# Patient Record
Sex: Male | Born: 1985 | Race: Black or African American | Hispanic: No | Marital: Married | Smoking: Current every day smoker
Health system: Southern US, Community
[De-identification: ages and names within clinical notes are randomized; demographics above are authoritative.]

## PROBLEM LIST (undated history)

## (undated) DIAGNOSIS — K219 Gastro-esophageal reflux disease without esophagitis: Secondary | ICD-10-CM

## (undated) DIAGNOSIS — M109 Gout, unspecified: Secondary | ICD-10-CM

## (undated) DIAGNOSIS — R569 Unspecified convulsions: Secondary | ICD-10-CM

## (undated) HISTORY — PX: HERNIA REPAIR: SHX51

## (undated) HISTORY — PX: FINGER SURGERY: SHX640

---

## 2013-03-20 ENCOUNTER — Encounter (HOSPITAL_BASED_OUTPATIENT_CLINIC_OR_DEPARTMENT_OTHER): Payer: Self-pay | Admitting: Emergency Medicine

## 2013-03-20 ENCOUNTER — Emergency Department (HOSPITAL_BASED_OUTPATIENT_CLINIC_OR_DEPARTMENT_OTHER)
Admission: EM | Admit: 2013-03-20 | Discharge: 2013-03-20 | Disposition: A | Discharge status: Home / Self Care | Payer: Self-pay | Attending: Emergency Medicine | Admitting: Emergency Medicine

## 2013-03-20 DIAGNOSIS — Z8719 Personal history of other diseases of the digestive system: Secondary | ICD-10-CM | POA: Insufficient documentation

## 2013-03-20 DIAGNOSIS — K5289 Other specified noninfective gastroenteritis and colitis: Secondary | ICD-10-CM | POA: Insufficient documentation

## 2013-03-20 DIAGNOSIS — Z87891 Personal history of nicotine dependence: Secondary | ICD-10-CM | POA: Insufficient documentation

## 2013-03-20 DIAGNOSIS — Z8669 Personal history of other diseases of the nervous system and sense organs: Secondary | ICD-10-CM | POA: Insufficient documentation

## 2013-03-20 DIAGNOSIS — K529 Noninfective gastroenteritis and colitis, unspecified: Secondary | ICD-10-CM

## 2013-03-20 DIAGNOSIS — M545 Low back pain, unspecified: Secondary | ICD-10-CM | POA: Insufficient documentation

## 2013-03-20 HISTORY — DX: Unspecified convulsions: R56.9

## 2013-03-20 HISTORY — DX: Gastro-esophageal reflux disease without esophagitis: K21.9

## 2013-03-20 LAB — URINALYSIS, ROUTINE W REFLEX MICROSCOPIC
Bilirubin Urine: NEGATIVE
GLUCOSE, UA: NEGATIVE mg/dL
HGB URINE DIPSTICK: NEGATIVE
KETONES UR: 15 mg/dL — AB
Leukocytes, UA: NEGATIVE
NITRITE: NEGATIVE
PH: 6.5 (ref 5.0–8.0)
PROTEIN: NEGATIVE mg/dL
Specific Gravity, Urine: 1.011 (ref 1.005–1.030)
Urobilinogen, UA: 0.2 mg/dL (ref 0.0–1.0)

## 2013-03-20 MED ORDER — DICYCLOMINE HCL 10 MG PO CAPS
10.0000 mg | ORAL_CAPSULE | Freq: Once | ORAL | Status: AC
  Administered 2013-03-20: 10 mg via ORAL
  Filled 2013-03-20: qty 1

## 2013-03-20 MED ORDER — DICYCLOMINE HCL 20 MG PO TABS: 20.0000 mg | ORAL_TABLET | Freq: Two times a day (BID) | ORAL | Status: AC

## 2013-03-20 NOTE — Discharge Instructions (Signed)
Diet for Diarrhea, Adult °Frequent, runny stools (diarrhea) may be caused or worsened by food or drink. Diarrhea may be relieved by changing your diet. Since diarrhea can last up to 7 days, it is easy for you to lose too much fluid from the body and become dehydrated. Fluids that are lost need to be replaced. Along with a modified diet, make sure you drink enough fluids to keep your urine clear or pale yellow. °DIET INSTRUCTIONS °· Ensure adequate fluid intake (hydration): have 1 cup (8 oz) of fluid for each diarrhea episode. Avoid fluids that contain simple sugars or sports drinks, fruit juices, whole milk products, and sodas. Your urine should be clear or pale yellow if you are drinking enough fluids. Hydrate with an oral rehydration solution that you can purchase at pharmacies, retail stores, and online. You can prepare an oral rehydration solution at home by mixing the following ingredients together: °·   tsp table salt. °· ¾ tsp baking soda. °·  tsp salt substitute containing potassium chloride. °· 1  tablespoons sugar. °· 1 L (34 oz) of water. °· Certain foods and beverages may increase the speed at which food moves through the gastrointestinal (GI) tract. These foods and beverages should be avoided and include: °· Caffeinated and alcoholic beverages. °· High-fiber foods, such as raw fruits and vegetables, nuts, seeds, and whole grain breads and cereals. °· Foods and beverages sweetened with sugar alcohols, such as xylitol, sorbitol, and mannitol. °· Some foods may be well tolerated and may help thicken stool including: °· Starchy foods, such as rice, toast, pasta, low-sugar cereal, oatmeal, grits, baked potatoes, crackers, and bagels.   °· Bananas.   °· Applesauce. °· Add probiotic-rich foods to help increase healthy bacteria in the GI tract, such as yogurt and fermented milk products. °RECOMMENDED FOODS AND BEVERAGES °Starches °Choose foods with less than 2 g of fiber per serving. °· Recommended:  White,  French, and pita breads, plain rolls, buns, bagels. Plain muffins, matzo. Soda, saltine, or graham crackers. Pretzels, melba toast, zwieback. Cooked cereals made with water: cornmeal, farina, cream cereals. Dry cereals: refined corn, wheat, rice. Potatoes prepared any way without skins, refined macaroni, spaghetti, noodles, refined rice. °· Avoid:  Bread, rolls, or crackers made with whole wheat, multi-grains, rye, bran seeds, nuts, or coconut. Corn tortillas or taco shells. Cereals containing whole grains, multi-grains, bran, coconut, nuts, raisins. Cooked or dry oatmeal. Coarse wheat cereals, granola. Cereals advertised as "high-fiber." Potato skins. Whole grain pasta, wild or brown rice. Popcorn. Sweet potatoes, yams. Sweet rolls, doughnuts, waffles, pancakes, sweet breads. °Vegetables °· Recommended: Strained tomato and vegetable juices. Most well-cooked and canned vegetables without seeds. Fresh: Tender lettuce, cucumber without the skin, cabbage, spinach, bean sprouts. °· Avoid: Fresh, cooked, or canned: Artichokes, baked beans, beet greens, broccoli, Brussels sprouts, corn, kale, legumes, peas, sweet potatoes. Cooked: Green or red cabbage, spinach. Avoid large servings of any vegetables because vegetables shrink when cooked, and they contain more fiber per serving than fresh vegetables. °Fruit °· Recommended: Cooked or canned: Apricots, applesauce, cantaloupe, cherries, fruit cocktail, grapefruit, grapes, kiwi, mandarin oranges, peaches, pears, plums, watermelon. Fresh: Apples without skin, ripe banana, grapes, cantaloupe, cherries, grapefruit, peaches, oranges, plums. Keep servings limited to ½ cup or 1 piece. °· Avoid: Fresh: Apples with skin, apricots, mangoes, pears, raspberries, strawberries. Prune juice, stewed or dried prunes. Dried fruits, raisins, dates. Large servings of all fresh fruits. °Protein °· Recommended: Ground or well-cooked tender beef, ham, veal, lamb, pork, or poultry. Eggs. Fish,  oysters, shrimp,   lobster, other seafoods. Liver, organ meats. °· Avoid: Tough, fibrous meats with gristle. Peanut butter, smooth or chunky. Cheese, nuts, seeds, legumes, dried peas, beans, lentils. °Dairy °· Recommended: Yogurt, lactose-free milk, kefir, drinkable yogurt, buttermilk, soy milk, or plain hard cheese. °· Avoid: Milk, chocolate milk, beverages made with milk, such as milkshakes. °Soups °· Recommended: Bouillon, broth, or soups made from allowed foods. Any strained soup. °· Avoid: Soups made from vegetables that are not allowed, cream or milk-based soups. °Desserts and Sweets °· Recommended: Sugar-free gelatin, sugar-free frozen ice pops made without sugar alcohol. °· Avoid: Plain cakes and cookies, pie made with fruit, pudding, custard, cream pie. Gelatin, fruit, ice, sherbet, frozen ice pops. Ice cream, ice milk without nuts. Plain hard candy, honey, jelly, molasses, syrup, sugar, chocolate syrup, gumdrops, marshmallows. °Fats and Oils °· Recommended: Limit fats to less than 8 tsp per day. °· Avoid: Seeds, nuts, olives, avocados. Margarine, butter, cream, mayonnaise, salad oils, plain salad dressings. Plain gravy, crisp bacon without rind. °Beverages °· Recommended: Water, decaffeinated teas, oral rehydration solutions, sugar-free beverages not sweetened with sugar alcohols. °· Avoid: Fruit juices, caffeinated beverages (coffee, tea, soda), alcohol, sports drinks, or lemon-lime soda. °Condiments °· Recommended: Ketchup, mustard, horseradish, vinegar, cocoa powder. Spices in moderation: allspice, basil, bay leaves, celery powder or leaves, cinnamon, cumin powder, curry powder, ginger, mace, marjoram, onion or garlic powder, oregano, paprika, parsley flakes, ground pepper, rosemary, sage, savory, tarragon, thyme, turmeric. °· Avoid: Coconut, honey. °Document Released: 03/13/2003 Document Revised: 09/15/2011 Document Reviewed: 05/07/2011 °[employer]® Patient Information ©2014 [employer]. ° °Viral  Gastroenteritis °Viral gastroenteritis is also known as stomach flu. This condition affects the stomach and intestinal tract. It can cause sudden diarrhea and vomiting. The illness typically lasts 3 to 8 days. Most people develop an immune response that eventually gets rid of the virus. While this natural response develops, the virus can make you quite ill. °CAUSES  °Many different viruses can cause gastroenteritis, such as rotavirus or noroviruses. You can catch one of these viruses by consuming contaminated food or water. You may also catch a virus by sharing utensils or other personal items with an infected person or by touching a contaminated surface. °SYMPTOMS  °The most common symptoms are diarrhea and vomiting. These problems can cause a severe loss of body fluids (dehydration) and a body salt (electrolyte) imbalance. Other symptoms may include: °· Fever. °· Headache. °· Fatigue. °· Abdominal pain. °DIAGNOSIS  °Your caregiver can usually diagnose viral gastroenteritis based on your symptoms and a physical exam. A stool sample may also be taken to test for the presence of viruses or other infections. °TREATMENT  °This illness typically goes away on its own. Treatments are aimed at rehydration. The most serious cases of viral gastroenteritis involve vomiting so severely that you are not able to keep fluids down. In these cases, fluids must be given through an intravenous line (IV). °HOME CARE INSTRUCTIONS  °· Drink enough fluids to keep your urine clear or pale yellow. Drink small amounts of fluids frequently and increase the amounts as tolerated. °· Ask your caregiver for specific rehydration instructions. °· Avoid: °· Foods high in sugar. °· Alcohol. °· Carbonated drinks. °· Tobacco. °· Juice. °· Caffeine drinks. °· Extremely hot or cold fluids. °· Fatty, greasy foods. °· Too much intake of anything at one time. °· Dairy products until 24 to 48 hours after diarrhea stops. °· You may consume probiotics.  Probiotics are active cultures of beneficial bacteria. They may lessen the amount and number   of diarrheal stools in adults. Probiotics can be found in yogurt with active cultures and in supplements. °· Wash your hands well to avoid spreading the virus. °· Only take over-the-counter or prescription medicines for pain, discomfort, or fever as directed by your caregiver. Do not give aspirin to children. Antidiarrheal medicines are not recommended. °· Ask your caregiver if you should continue to take your regular prescribed and over-the-counter medicines. °· Keep all follow-up appointments as directed by your caregiver. °SEEK IMMEDIATE MEDICAL CARE IF:  °· You are unable to keep fluids down. °· You do not urinate at least once every 6 to 8 hours. °· You develop shortness of breath. °· You notice blood in your stool or vomit. This may look like coffee grounds. °· You have abdominal pain that increases or is concentrated in one small area (localized). °· You have persistent vomiting or diarrhea. °· You have a fever. °· The patient is a child younger than 3 months, and he or she has a fever. °· The patient is a child older than 3 months, and he or she has a fever and persistent symptoms. °· The patient is a child older than 3 months, and he or she has a fever and symptoms suddenly get worse. °· The patient is a baby, and he or she has no tears when crying. °MAKE SURE YOU:  °· Understand these instructions. °· Will watch your condition. °· Will get help right away if you are not doing well or get worse. °Document Released: 12/21/2004 Document Revised: 03/15/2011 Document Reviewed: 10/07/2010 °ExitCare® Patient Information ©2014 [employer]. ° °

## 2013-03-20 NOTE — ED Notes (Signed)
Patient states he has a two week history of intestinal bug with diarrhea.  States the diarrhea is resolving.  Has been taking Prevacid.  Continues to have burning in his stomach.  Generalized body aches and fatigue.

## 2013-03-20 NOTE — ED Provider Notes (Signed)
CSN: [id number]     Arrival date & time 03/20/13  1149 History   First MD Initiated Contact with Patient 03/20/13 1209     Chief Complaint  Patient presents with  . Back Pain     (Consider location/radiation/quality/duration/timing/severity/associated sxs/prior Treatment) HPI [age] show male comes in today stating that he had gastroenteritis-type symptoms 2 weeks ago followed by some reflux type symptoms. He was seen at [facility] and started on medication for this this helped. He states that he was improved until Saturday night when he began having some abdominal cramping and nausea followed by several episodes of diarrhea on Sunday. He states he is continued to have some crampy upper normal pain whenever he tries to the or drink. Not had any vomiting and the diarrhea has resolved. He had some subjective fever but has not had any chills. He states that he feels like he was urinating more than usual and had some low back pain. He has not had any similar episodes in the past. He has had a hernia repair but no other abdominal surgery.  Past Medical History  Diagnosis Date  . Acid reflux   . Seizures    Past Surgical History  Procedure Laterality Date  . Hernia repair    . Finger surgery     No family history on file. History  Substance Use Topics  . Smoking status: Former Games developermoker  . Smokeless tobacco: Not on file  . Alcohol Use: No    Review of Systems  All other systems reviewed and are negative.      Allergies  Prevacid  Home Medications  No current outpatient prescriptions on file. BP 143/91  Temp(Src) 97.8 F (36.6 C) (Oral)  Resp 20  Ht 5\' 11"  (1.803 m)  Wt 224 lb (101.606 kg)  BMI 31.26 kg/m2  SpO2 100% Physical Exam  Nursing note and vitals reviewed. Constitutional: He is oriented to person, place, and time. He appears well-developed and well-nourished.  HENT:  Head: Normocephalic and atraumatic.  Right Ear: External ear normal.  Left Ear: External  ear normal.  Nose: Nose normal.  Mouth/Throat: Oropharynx is clear and moist.  Eyes: Conjunctivae and EOM are normal. Pupils are equal, round, and reactive to light.  Neck: Normal range of motion. Neck supple.  Cardiovascular: Normal rate, regular rhythm, normal heart sounds and intact distal pulses.   Pulmonary/Chest: Effort normal and breath sounds normal. No respiratory distress. He has no wheezes. He exhibits no tenderness.  Abdominal: Soft. Bowel sounds are normal. He exhibits no distension and no mass. There is no tenderness. There is no guarding.  Musculoskeletal: Normal range of motion.  Neurological: He is alert and oriented to person, place, and time. He has normal reflexes. He exhibits normal muscle tone. Coordination normal.  Skin: Skin is warm and dry.  Psychiatric: He has a normal mood and affect. His behavior is normal. Judgment and thought content normal.    ED Course  Procedures (including critical care time) Labs Review Labs Reviewed  URINALYSIS, ROUTINE W REFLEX MICROSCOPIC - Abnormal; Notable for the following:    Ketones, ur 15 (*)    All other components within normal limits   Imaging Review No results found.   EKG Interpretation None      MDM   Final diagnoses:  None    Patient is advised to have clear liquids for the next 12-24 hours. He is given Bentyl. He is given referral to primary care. He is given return precautions  and voices understanding that if the pain worsens, localizes, this not resolve in the next 24 hours that he should be reevaluated. Currently his abdomen is soft and nontender. Urine does not show any evidence of infection we does have some ketones. He has not actually vomiting and is taking by mouth without difficulty.    Hilario Quarry, MD 03/20/13 [phone]

## 2013-10-05 ENCOUNTER — Emergency Department (HOSPITAL_BASED_OUTPATIENT_CLINIC_OR_DEPARTMENT_OTHER): Payer: No Typology Code available for payment source

## 2013-10-05 ENCOUNTER — Encounter (HOSPITAL_BASED_OUTPATIENT_CLINIC_OR_DEPARTMENT_OTHER): Payer: Self-pay | Admitting: Emergency Medicine

## 2013-10-05 ENCOUNTER — Emergency Department (HOSPITAL_BASED_OUTPATIENT_CLINIC_OR_DEPARTMENT_OTHER)
Admission: EM | Admit: 2013-10-05 | Discharge: 2013-10-05 | Disposition: A | Discharge status: Home / Self Care | Payer: No Typology Code available for payment source | Attending: Emergency Medicine | Admitting: Emergency Medicine

## 2013-10-05 DIAGNOSIS — Z8719 Personal history of other diseases of the digestive system: Secondary | ICD-10-CM | POA: Insufficient documentation

## 2013-10-05 DIAGNOSIS — S8002XA Contusion of left knee, initial encounter: Secondary | ICD-10-CM

## 2013-10-05 DIAGNOSIS — Y9389 Activity, other specified: Secondary | ICD-10-CM | POA: Insufficient documentation

## 2013-10-05 DIAGNOSIS — Z720 Tobacco use: Secondary | ICD-10-CM | POA: Insufficient documentation

## 2013-10-05 DIAGNOSIS — Y92410 Unspecified street and highway as the place of occurrence of the external cause: Secondary | ICD-10-CM | POA: Insufficient documentation

## 2013-10-05 DIAGNOSIS — S3982XA Other specified injuries of lower back, initial encounter: Secondary | ICD-10-CM | POA: Insufficient documentation

## 2013-10-05 DIAGNOSIS — Z8669 Personal history of other diseases of the nervous system and sense organs: Secondary | ICD-10-CM | POA: Insufficient documentation

## 2013-10-05 DIAGNOSIS — Z79899 Other long term (current) drug therapy: Secondary | ICD-10-CM | POA: Insufficient documentation

## 2013-10-05 MED ORDER — HYDROCODONE-ACETAMINOPHEN 5-325 MG PO TABS: 1.0000 | ORAL_TABLET | ORAL | Status: AC | PRN

## 2013-10-05 MED ORDER — CYCLOBENZAPRINE HCL 10 MG PO TABS: 10.0000 mg | ORAL_TABLET | Freq: Two times a day (BID) | ORAL | Status: AC | PRN

## 2013-10-05 MED ORDER — IBUPROFEN 800 MG PO TABS: 800.0000 mg | ORAL_TABLET | Freq: Three times a day (TID) | ORAL | Status: AC

## 2013-10-05 NOTE — ED Provider Notes (Signed)
Medical screening examination/treatment/procedure(s) were performed by non-physician practitioner and as supervising physician I was immediately available for consultation/collaboration.   EKG Interpretation None        Hamna Asa, MD 10/05/13 2334 
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
## 2013-10-05 NOTE — ED Notes (Signed)
Pt brought back from xray. 

## 2013-10-05 NOTE — ED Notes (Signed)
Patient transported to X-ray 

## 2017-03-23 ENCOUNTER — Encounter (HOSPITAL_BASED_OUTPATIENT_CLINIC_OR_DEPARTMENT_OTHER): Payer: Self-pay | Admitting: Emergency Medicine

## 2017-03-23 ENCOUNTER — Emergency Department (HOSPITAL_BASED_OUTPATIENT_CLINIC_OR_DEPARTMENT_OTHER)
Admission: EM | Admit: 2017-03-23 | Discharge: 2017-03-23 | Disposition: A | Discharge status: Home / Self Care | Payer: Self-pay | Attending: Emergency Medicine | Admitting: Emergency Medicine

## 2017-03-23 ENCOUNTER — Other Ambulatory Visit: Payer: Self-pay

## 2017-03-23 ENCOUNTER — Emergency Department (HOSPITAL_BASED_OUTPATIENT_CLINIC_OR_DEPARTMENT_OTHER): Payer: Self-pay

## 2017-03-23 DIAGNOSIS — Z79899 Other long term (current) drug therapy: Secondary | ICD-10-CM | POA: Insufficient documentation

## 2017-03-23 DIAGNOSIS — M79674 Pain in right toe(s): Secondary | ICD-10-CM | POA: Insufficient documentation

## 2017-03-23 DIAGNOSIS — F17210 Nicotine dependence, cigarettes, uncomplicated: Secondary | ICD-10-CM | POA: Insufficient documentation

## 2017-03-23 MED ORDER — INDOMETHACIN 50 MG PO CAPS: 50.0000 mg | ORAL_CAPSULE | Freq: Two times a day (BID) | ORAL | 0 refills | Status: AC

## 2017-03-23 MED ORDER — HYDROCODONE-ACETAMINOPHEN 5-325 MG PO TABS
1.0000 | ORAL_TABLET | Freq: Once | ORAL | Status: AC
  Administered 2017-03-23: 1 via ORAL
  Filled 2017-03-23: qty 1

## 2017-03-23 NOTE — Discharge Instructions (Addendum)
Take medication as directed.  Apply ice to the affected area.  As we discussed, elevate the foot to help with pain.  Follow-up with the [facility] for [facility] care establishment.  Return to the emergency department for any fever, worsening redness or swelling, worsening pain, numbness/weakness or any other worsening or concerning symptoms.

## 2017-03-23 NOTE — ED Triage Notes (Signed)
Patient states that he has had pain to his right big toe since Friday  - no noted swelling or injury at this time

## 2017-03-23 NOTE — ED Notes (Signed)
NAD at this time. Pt is stable and going home.  

## 2017-03-23 NOTE — ED Provider Notes (Signed)
[facility] EMERGENCY DEPARTMENT Provider Note   CSN: [id number] Arrival date & time: 03/23/17  1543     History   Chief Complaint Chief Complaint  Patient presents with  . Toe Pain    HPI Bryan Ray is a 32 y.o. male who presents for evaluation of 4 days of right first toe pain.  Patient also notes he has had some redness and swelling overlying the first toe.  He denies any preceding trauma, injury, fall.  Patient reports he has been able to ambulate but reports worsening pain with ambulation.  He has been taking ibuprofen with minimal improvement.  No other therapies at home.  Patient states that he does not have a history of gout but states his dad does.  Patient denies any fevers, warmth, redness of the foot, leg, numbness/weakness.  The history is provided by the patient.    Past Medical History:  Diagnosis Date  . Acid reflux   . Seizures (HCC)     There are no active problems to display for this patient.   Past Surgical History:  Procedure Laterality Date  . FINGER SURGERY    . HERNIA REPAIR         Home Medications    Prior to Admission medications   Medication Sig Start Date End Date Taking? Authorizing Provider  cyclobenzaprine (FLEXERIL) 10 MG tablet Take 1 tablet (10 mg total) by mouth 2 (two) times daily as needed for muscle spasms. 10/05/13   Elpidio Anis, PA-C  dicyclomine (BENTYL) 20 MG tablet Take 1 tablet (20 mg total) by mouth 2 (two) times daily. 03/20/13   Margarita Grizzle, MD  HYDROcodone-acetaminophen (NORCO/VICODIN) 5-325 MG per tablet Take 1-2 tablets by mouth every 4 (four) hours as needed. 10/05/13   Elpidio Anis, PA-C  ibuprofen (ADVIL,MOTRIN) 800 MG tablet Take 1 tablet (800 mg total) by mouth 3 (three) times daily. 10/05/13   Elpidio Anis, PA-C  indomethacin (INDOCIN) 50 MG capsule Take 1 capsule (50 mg total) by mouth 2 (two) times daily with a meal for 10 days. 03/23/17 04/02/17  Maxwell Caul, PA-C    Family  History History reviewed. No pertinent family history.  Social History Social History   Tobacco Use  . Smoking status: Current Every Day Smoker  . Smokeless tobacco: Never Used  Substance Use Topics  . Alcohol use: No  . Drug use: No     Allergies   Prevacid [lansoprazole]   Review of Systems Review of Systems  Constitutional: Negative for fever.  Musculoskeletal:       Toe pain  Skin: Positive for color change.  Neurological: Negative for weakness and numbness.     Physical Exam Updated Vital Signs BP (!) 156/98 (BP Location: Left Arm)   Pulse (!) 110   Temp 98 F (36.7 C) (Oral)   Resp 20   Ht 5\' 11"  (1.803 m)   Wt 122.5 kg (270 lb)   SpO2 100%   BMI 37.66 kg/m   Physical Exam  Constitutional: He appears well-developed and well-nourished.  HENT:  Head: Normocephalic and atraumatic.  Eyes: Conjunctivae and EOM are normal. Right eye exhibits no discharge. Left eye exhibits no discharge. No scleral icterus.  Cardiovascular:  Pulses:      Dorsalis pedis pulses are 2+ on the right side, and 2+ on the left side.  Pulmonary/Chest: Effort normal.  Musculoskeletal:  Exquisite tenderness even to light touch of the right first toe.  There is some mild overlying erythema to  the dorsal aspect that does not extend  circumferential of the entire toe.  Good range of motion of all 5 digits of right foot without any difficulty.Tenderness palpation to the dorsal aspect of the foot, right ankle.  No overlying warmth, erythema, soft tissue swelling.  No deformity or crepitus noted.  Neurological: He is alert.  Sensation intact along major nerve distributions of BLE  Skin: Skin is warm and dry. Capillary refill takes less than 2 seconds.  Good distal cap refill. RLE is not dusky in appearance or cool to touch.  Psychiatric: He has a normal mood and affect. His speech is normal and behavior is normal.  Nursing note and vitals reviewed.    ED Treatments / Results  Labs (all  labs ordered are listed, but only abnormal results are displayed) Labs Reviewed - No data to display  EKG  EKG Interpretation None       Radiology Dg Foot Complete Right  Result Date: 03/23/2017 CLINICAL DATA:  Pt has pain and swelling on medial side of big toe X 4 days. Pt also states that he has some achy pain in the lateral side of ankle. No known injury. EXAM: RIGHT FOOT COMPLETE - 3+ VIEW COMPARISON:  None. FINDINGS: There is no evidence of fracture or dislocation. 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 Soft tissues are unremarkable. IMPRESSION: 1.  No acute osseous injury of the right foot. Electronically Signed   By: Elige Ko   On: 03/23/2017 16:25    Procedures Procedures (including critical care time)  Medications Ordered in ED Medications  HYDROcodone-acetaminophen (NORCO/VICODIN) 5-325 MG per tablet 1 tablet (1 tablet Oral Given 03/23/17 1609)     Initial Impression / Assessment and Plan / ED Course  I have reviewed the triage vital signs and the nursing notes.  Pertinent labs & imaging results that were available during my care of the patient were reviewed by me and considered in my medical decision making (see chart for details).     32 year old male who presents for evaluation of 4 days of right first toe pain.  Associated with some overlying warmth and erythema.  No fevers, numbness/weakness.  Patient denies any history of gout but states that his father has a history of gout.  No preceding trauma, injury. Patient is afebrile, non-toxic appearing, sitting comfortably on examination table. Vital signs reviewed and stable. Patient is neurovascularly intact.  On exam, patient does have tenderness palpation to the right first toe to even light touch.  There is some erythema noted to the dorsal aspect of the toe but is not circumferential.  No tenderness palpation dorsal aspect of  the foot, ankle.  Suspect that symptoms are likely secondary to gout.  History/physical exam is not concerning for septic arthritis, DVT of right lower extremity.  Will get x-ray evaluation.  Analgesics provided in the department.  X-rays reviewed.  Negative for any acute abnormality.  I discussed results with patient.  We will plan to treat his scalp.  Encourage establishment with a primary care doctor for full evaluation and diagnosis of gout.  Patient with recent blood work that showed normal creatinine.  Will start on indomethacin for treatment evaluation.  Encourage supportive care therapies at home. Patient had ample opportunity for questions and discussion. All patient's questions were answered with full understanding. Strict return precautions discussed. Patient expresses understanding and agreement to plan.   Final Clinical Impressions(s) /  ED Diagnoses   Final diagnoses:  Pain of toe of right foot    ED Discharge Orders        Ordered    indomethacin (INDOCIN) 50 MG capsule  2 times daily with meals     03/23/17 1655       Maxwell CaulLayden, Amrita Radu A, PA-C 03/24/17 0040    Pricilla LovelessGoldston, [place], MD 03/24/17 [id number]

## 2019-07-07 ENCOUNTER — Other Ambulatory Visit: Payer: Self-pay

## 2019-07-07 ENCOUNTER — Encounter (HOSPITAL_BASED_OUTPATIENT_CLINIC_OR_DEPARTMENT_OTHER): Payer: Self-pay

## 2019-07-07 ENCOUNTER — Emergency Department (HOSPITAL_BASED_OUTPATIENT_CLINIC_OR_DEPARTMENT_OTHER)
Admission: EM | Admit: 2019-07-07 | Discharge: 2019-07-07 | Disposition: A | Discharge status: Home / Self Care | Payer: Self-pay | Attending: Emergency Medicine | Admitting: Emergency Medicine

## 2019-07-07 DIAGNOSIS — M10071 Idiopathic gout, right ankle and foot: Secondary | ICD-10-CM | POA: Insufficient documentation

## 2019-07-07 DIAGNOSIS — F17200 Nicotine dependence, unspecified, uncomplicated: Secondary | ICD-10-CM | POA: Insufficient documentation

## 2019-07-07 HISTORY — DX: Gout, unspecified: M10.9

## 2019-07-07 MED ORDER — INDOMETHACIN 50 MG PO CAPS: 50.0000 mg | ORAL_CAPSULE | Freq: Two times a day (BID) | ORAL | 0 refills | Status: AC

## 2019-07-07 NOTE — ED Provider Notes (Signed)
[facility] EMERGENCY DEPARTMENT Provider Note  CSN: [id number] Arrival date & time: 07/07/19 2254  Chief Complaint(s) Foot Pain  HPI Bryan Ray is a 34 y.o. male   CC: toe pain  Onset/Duration: gradual, 2 days Timing: constant, worsening Location: right MTP Quality: aching/throbbing Severity: moderate Modifying Factors:  Improved by: motrin, immobility  Worsened by: ROM, walking, palpation Associated Signs/Symptoms:  Pertinent (+): swelling at the joint  Pertinent (-): fevers, chills, trauma, wounds Context: similar to prior gout flare   HPI  Past Medical History Past Medical History:  Diagnosis Date  . Acid reflux   . Gout   . Seizures (HCC)    There are no problems to display for this patient.  Home Medication(s) Prior to Admission medications   Medication Sig Start Date End Date Taking? Authorizing Provider  cyclobenzaprine (FLEXERIL) 10 MG tablet Take 1 tablet (10 mg total) by mouth 2 (two) times daily as needed for muscle spasms. 10/05/13   Elpidio Anis, PA-C  dicyclomine (BENTYL) 20 MG tablet Take 1 tablet (20 mg total) by mouth 2 (two) times daily. 03/20/13   Margarita Grizzle, MD  HYDROcodone-acetaminophen (NORCO/VICODIN) 5-325 MG per tablet Take 1-2 tablets by mouth every 4 (four) hours as needed. 10/05/13   Elpidio Anis, PA-C  ibuprofen (ADVIL,MOTRIN) 800 MG tablet Take 1 tablet (800 mg total) by mouth 3 (three) times daily. 10/05/13   Elpidio Anis, PA-C  indomethacin (INDOCIN) 50 MG capsule Take 1 capsule (50 mg total) by mouth 2 (two) times daily with a meal. 07/07/19   Helma Argyle, Amadeo Garnet, MD                                                                                                                                    Past Surgical History Past Surgical History:  Procedure Laterality Date  . FINGER SURGERY    . HERNIA REPAIR     Family History No family history on file.  Social History Social History   Tobacco Use  . Smoking  status: Current Every Day Smoker  . Smokeless tobacco: Never Used  Vaping Use  . Vaping Use: Never used  Substance Use Topics  . Alcohol use: Not Currently  . Drug use: No   Allergies Prevacid [lansoprazole]  Review of Systems Review of Systems All other systems are reviewed and are negative for acute change except as noted in the HPI  Physical Exam Vital Signs  I have reviewed the triage vital signs BP (!) 150/100 (BP Location: Right Arm)   Pulse 80   Temp 97.9 F (36.6 C) (Oral)   Resp 20   Ht 5\' 11"  (1.803 m)   Wt 113.4 kg   SpO2 98%   BMI 34.87 kg/m   Physical Exam Vitals reviewed.  Constitutional:      General: He is not in acute distress.    Appearance: He is well-developed. He is obese. He is not  diaphoretic.  HENT:     Head: Normocephalic and atraumatic.     Jaw: No trismus.     Right Ear: External ear normal.     Left Ear: External ear normal.     Nose: Nose normal.  Eyes:     General: No scleral icterus.    Conjunctiva/sclera: Conjunctivae normal.  Neck:     Trachea: Phonation normal.  Cardiovascular:     Rate and Rhythm: Normal rate and regular rhythm.  Pulmonary:     Effort: Pulmonary effort is normal. No respiratory distress.     Breath sounds: No stridor.  Abdominal:     General: There is no distension.  Musculoskeletal:        General: Normal range of motion.     Cervical back: Normal range of motion.       Feet:  Neurological:     Mental Status: He is alert and oriented to person, place, and time.  Psychiatric:        Behavior: Behavior normal.     ED Results and Treatments Labs (all labs ordered are listed, but only abnormal results are displayed) Labs Reviewed - No data to display                                                                                                                       EKG  EKG Interpretation  Date/Time:    Ventricular Rate:    PR Interval:    QRS Duration:   QT Interval:    QTC Calculation:   R  Axis:     Text Interpretation:        Radiology No results found.  Pertinent labs & imaging results that were available during my care of the patient were reviewed by me and considered in my medical decision making (see chart for details).  Medications Ordered in ED Medications - No data to display                                                                                                                                  Procedures Procedures  (including critical care time)  Medical Decision Making / ED Course I have reviewed the nursing notes for this encounter and the patient's prior records (if available in EHR or on provided paperwork).   [name] was evaluated in Emergency Department on 07/07/2019 for the symptoms described in the history of present  illness. He was evaluated in the context of the global COVID-19 pandemic, which necessitated consideration that the patient might be at risk for infection with the SARS-CoV-2 virus that causes COVID-19. Institutional protocols and algorithms that pertain to the evaluation of patients at risk for COVID-19 are in a state of rapid change based on information released by regulatory bodies including the CDC and federal and state organizations. These policies and algorithms were followed during the patient's care in the ED.  Similar to prior gout flare. No wounds concerning for infection. Doubt septic joint. No trauma requiring imaging.  Will Rx indomethacin as it worked well last time. He is working on establishing care with a PCP.      Final Clinical Impression(s) / ED Diagnoses Final diagnoses:  Acute idiopathic gout involving toe of right foot    The patient appears reasonably screened and/or stabilized for discharge and I doubt any other medical condition or other [facility] requiring further screening, evaluation, or treatment in the ED at this time prior to discharge. Safe for discharge with strict return  precautions.  Disposition: Discharge  Condition: Good  I have discussed the results, Dx and Tx plan with the patient/family who expressed understanding and agree(s) with the plan. Discharge instructions discussed at length. The patient/family was given strict return precautions who verbalized understanding of the instructions. No further questions at time of discharge.    ED Discharge Orders         Ordered    indomethacin (INDOCIN) 50 MG capsule  2 times daily with meals     Discontinue  Reprint     07/07/19 2309           Follow Up: Primary care provider  Schedule an appointment as soon as possible for a visit  If you do not have a primary care physician, contact [facility] at [phone] for referral     This chart was dictated using voice recognition software.  Despite best efforts to proofread,  errors can occur which can change the documentation meaning.   Nira Conn, MD 07/07/19 2314

## 2019-07-07 NOTE — ED Triage Notes (Signed)
Pt presents with pain to his R great toe and foot swelling x 2 days. Worse today. Pt states symptoms similar to previous gout episodes.

## 2019-10-26 IMAGING — DX DG FOOT COMPLETE 3+V*R*
3 series · 3 of 3 positions shown · non-contrast
Comparison: None.

CLINICAL DATA: Pt has pain and swelling on medial side of big toe X
4 days. Pt also states that he has some achy pain in the lateral
side of ankle. No known injury.

EXAM:
RIGHT FOOT COMPLETE - 3+ VIEW

[foot ap]
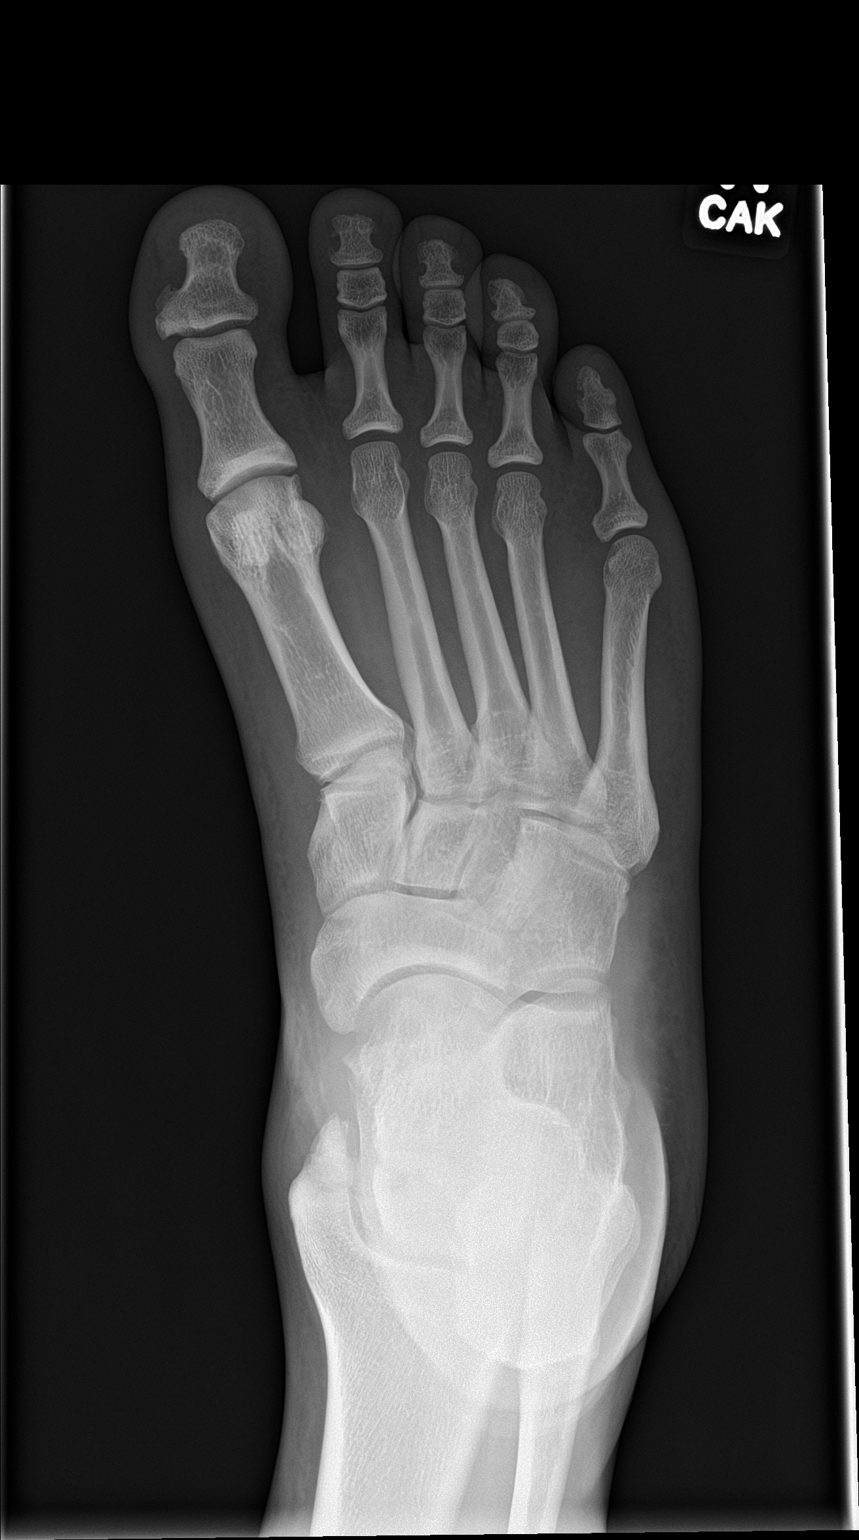

[foot obl]
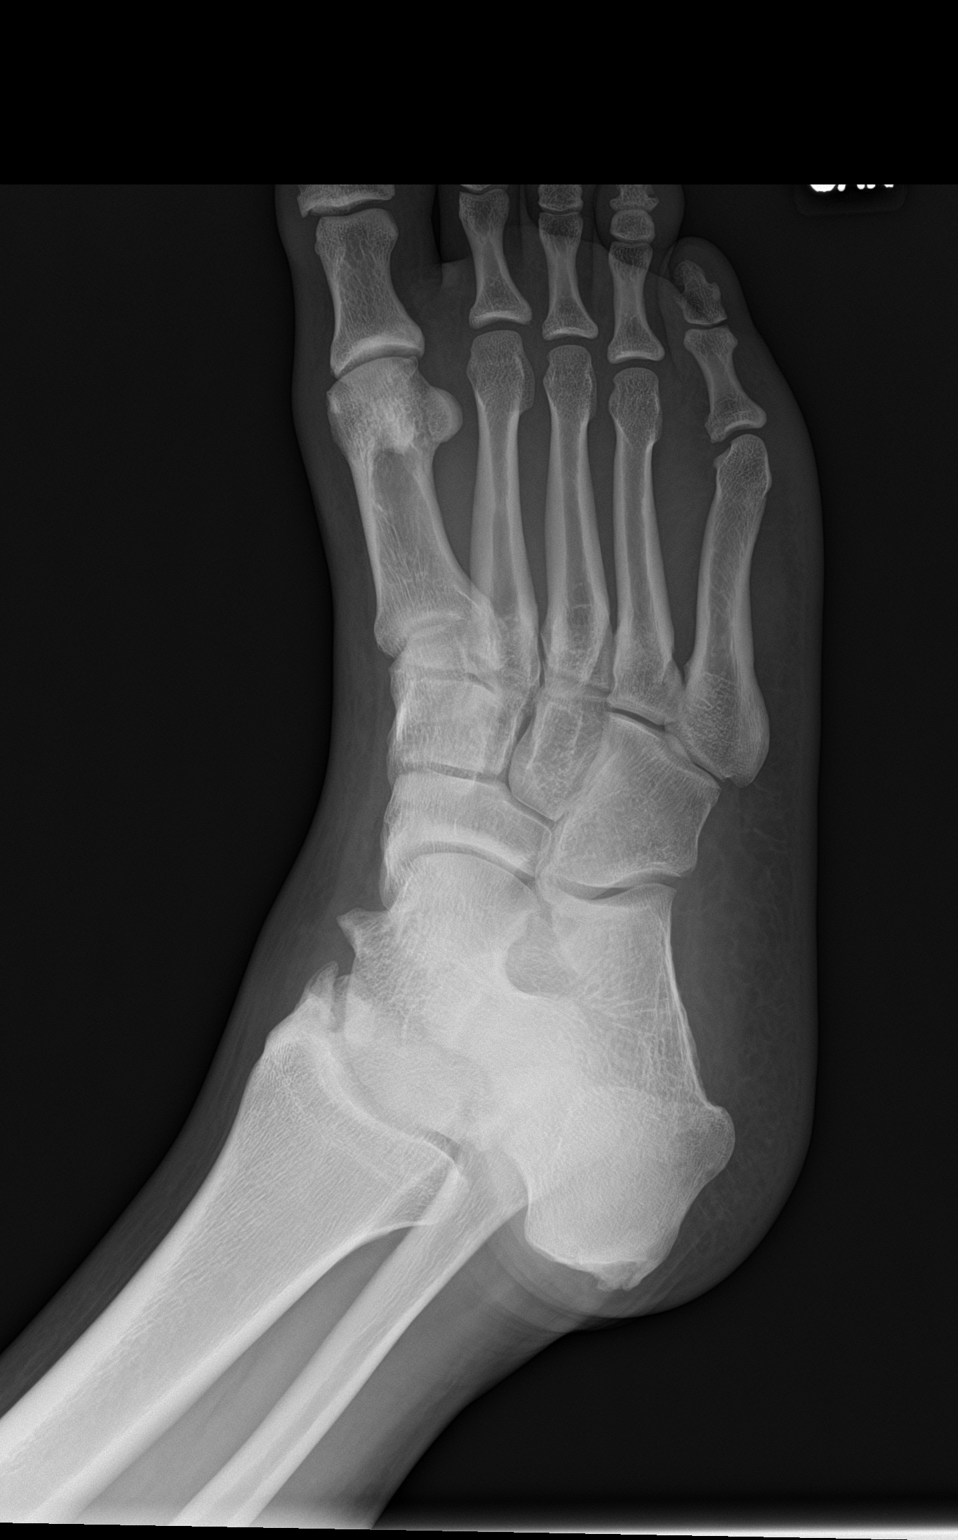

[foot lat]
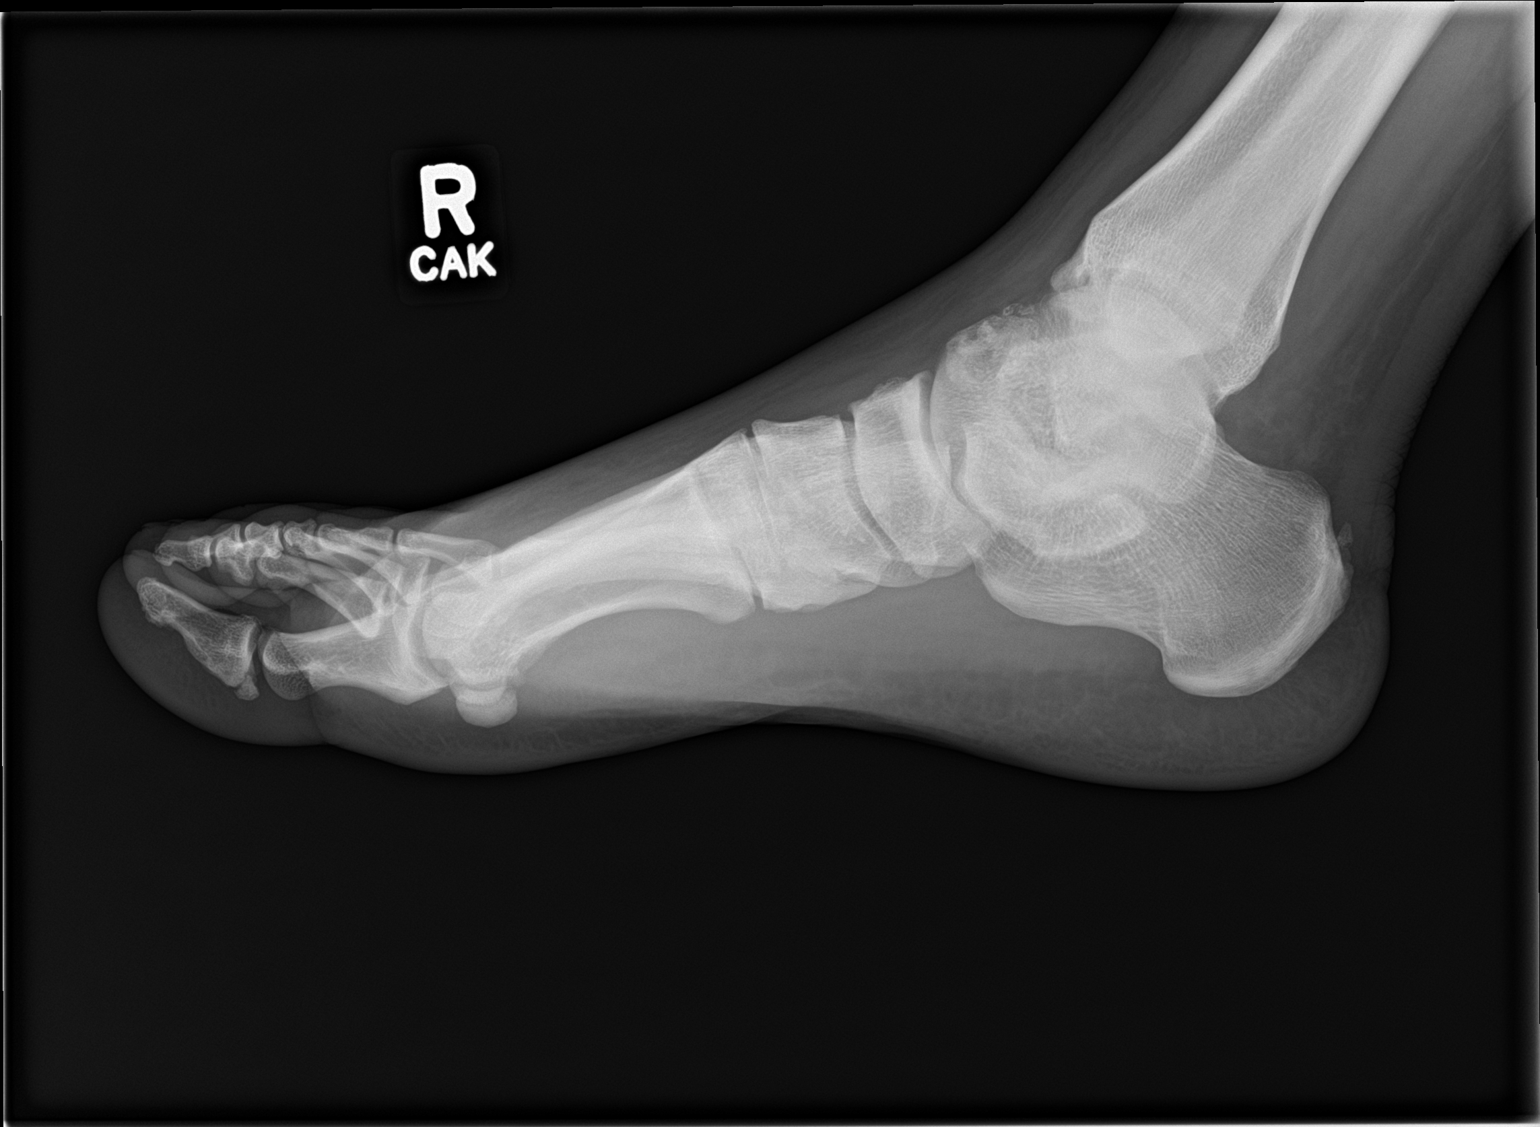

[3 of 3 positions shown; findings below may reference images not displayed]

FINDINGS: There is no evidence of fracture or dislocation. There is minimal
osteoarthritis of the first TMT joint and first MTP joint. There is
mild osteoarthritis of the tibiotalar joint. Soft tissues are
unremarkable.
IMPRESSION: 1.  No acute osseous injury of the right foot.
